# Patient Record
Sex: Male | Born: 1995 | Race: Black or African American | Hispanic: No | Marital: Single | Smoking: Current some day smoker
Health system: Southern US, Community
[De-identification: ages and names within clinical notes are randomized; demographics above are authoritative.]

---

## 2016-04-30 ENCOUNTER — Emergency Department (HOSPITAL_COMMUNITY): Payer: No Typology Code available for payment source

## 2016-04-30 ENCOUNTER — Observation Stay (HOSPITAL_COMMUNITY)
Admission: EM | Admit: 2016-04-30 | Discharge: 2016-05-01 | Disposition: A | Discharge status: Home / Self Care | Payer: No Typology Code available for payment source | Attending: Orthopedic Surgery | Admitting: Orthopedic Surgery

## 2016-04-30 ENCOUNTER — Encounter (HOSPITAL_COMMUNITY)
Admission: EM | Disposition: A | Discharge status: Home / Self Care | Payer: Self-pay | Source: Home / Self Care | Attending: Emergency Medicine

## 2016-04-30 ENCOUNTER — Observation Stay (HOSPITAL_COMMUNITY): Payer: No Typology Code available for payment source | Admitting: Anesthesiology

## 2016-04-30 ENCOUNTER — Encounter (HOSPITAL_COMMUNITY): Payer: Self-pay | Admitting: Emergency Medicine

## 2016-04-30 ENCOUNTER — Observation Stay (HOSPITAL_COMMUNITY): Payer: No Typology Code available for payment source

## 2016-04-30 DIAGNOSIS — Y998 Other external cause status: Secondary | ICD-10-CM | POA: Diagnosis not present

## 2016-04-30 DIAGNOSIS — M25522 Pain in left elbow: Secondary | ICD-10-CM | POA: Insufficient documentation

## 2016-04-30 DIAGNOSIS — Y9289 Other specified places as the place of occurrence of the external cause: Secondary | ICD-10-CM | POA: Insufficient documentation

## 2016-04-30 DIAGNOSIS — Y9389 Activity, other specified: Secondary | ICD-10-CM | POA: Insufficient documentation

## 2016-04-30 DIAGNOSIS — M25511 Pain in right shoulder: Secondary | ICD-10-CM | POA: Diagnosis not present

## 2016-04-30 DIAGNOSIS — M5382 Other specified dorsopathies, cervical region: Secondary | ICD-10-CM | POA: Diagnosis not present

## 2016-04-30 DIAGNOSIS — F17200 Nicotine dependence, unspecified, uncomplicated: Secondary | ICD-10-CM | POA: Insufficient documentation

## 2016-04-30 DIAGNOSIS — S52022B Displaced fracture of olecranon process without intraarticular extension of left ulna, initial encounter for open fracture type I or II: Secondary | ICD-10-CM | POA: Diagnosis not present

## 2016-04-30 DIAGNOSIS — S42402A Unspecified fracture of lower end of left humerus, initial encounter for closed fracture: Secondary | ICD-10-CM

## 2016-04-30 DIAGNOSIS — S42402B Unspecified fracture of lower end of left humerus, initial encounter for open fracture: Secondary | ICD-10-CM

## 2016-04-30 DIAGNOSIS — S0083XA Contusion of other part of head, initial encounter: Secondary | ICD-10-CM | POA: Insufficient documentation

## 2016-04-30 DIAGNOSIS — S0990XA Unspecified injury of head, initial encounter: Secondary | ICD-10-CM

## 2016-04-30 HISTORY — PX: INCISION AND DRAINAGE OF WOUND: SHX1803

## 2016-04-30 HISTORY — PX: ORIF ELBOW FRACTURE: SHX5031

## 2016-04-30 LAB — BASIC METABOLIC PANEL
ANION GAP: 12 (ref 5–15)
BUN: 10 mg/dL (ref 6–20)
CALCIUM: 9.5 mg/dL (ref 8.9–10.3)
CO2: 23 mmol/L (ref 22–32)
Chloride: 105 mmol/L (ref 101–111)
Creatinine, Ser: 1.18 mg/dL (ref 0.61–1.24)
Glucose, Bld: 99 mg/dL (ref 65–99)
Potassium: 3.6 mmol/L (ref 3.5–5.1)
SODIUM: 140 mmol/L (ref 135–145)

## 2016-04-30 LAB — TYPE AND SCREEN
ABO/RH(D): O POS
ANTIBODY SCREEN: NEGATIVE

## 2016-04-30 LAB — CBC WITH DIFFERENTIAL/PLATELET
BASOS PCT: 0 %
Basophils Absolute: 0 10*3/uL (ref 0.0–0.1)
Eosinophils Absolute: 0 10*3/uL (ref 0.0–0.7)
Eosinophils Relative: 0 %
HEMATOCRIT: 37.9 % — AB (ref 39.0–52.0)
HEMOGLOBIN: 13 g/dL (ref 13.0–17.0)
LYMPHS ABS: 1.2 10*3/uL (ref 0.7–4.0)
Lymphocytes Relative: 6 %
MCH: 30.4 pg (ref 26.0–34.0)
MCHC: 34.3 g/dL (ref 30.0–36.0)
MCV: 88.6 fL (ref 78.0–100.0)
MONOS PCT: 5 %
Monocytes Absolute: 1 10*3/uL (ref 0.1–1.0)
NEUTROS ABS: 16.9 10*3/uL — AB (ref 1.7–7.7)
NEUTROS PCT: 89 %
Platelets: 238 10*3/uL (ref 150–400)
RBC: 4.28 MIL/uL (ref 4.22–5.81)
RDW: 11.7 % (ref 11.5–15.5)
WBC: 19.2 10*3/uL — ABNORMAL HIGH (ref 4.0–10.5)

## 2016-04-30 LAB — ABO/RH: ABO/RH(D): O POS

## 2016-04-30 LAB — SURGICAL PCR SCREEN
MRSA, PCR: NEGATIVE
STAPHYLOCOCCUS AUREUS: NEGATIVE

## 2016-04-30 LAB — ETHANOL: Alcohol, Ethyl (B): 17 mg/dL — ABNORMAL HIGH (ref <5)

## 2016-04-30 SURGERY — IRRIGATION AND DEBRIDEMENT WOUND
Anesthesia: General | Site: Elbow | Laterality: Left

## 2016-04-30 MED ORDER — SODIUM CHLORIDE 0.9 % IV SOLN
INTRAVENOUS | Status: DC
  Administered 2016-04-30 (×2): via INTRAVENOUS

## 2016-04-30 MED ORDER — SUFENTANIL CITRATE 50 MCG/ML IV SOLN
INTRAVENOUS | Status: AC
  Filled 2016-04-30: qty 1

## 2016-04-30 MED ORDER — ACETAMINOPHEN 500 MG PO TABS
1000.0000 mg | ORAL_TABLET | Freq: Four times a day (QID) | ORAL | Status: DC
  Administered 2016-05-01 (×2): 1000 mg via ORAL
  Filled 2016-04-30 (×3): qty 2

## 2016-04-30 MED ORDER — BUPIVACAINE HCL (PF) 0.25 % IJ SOLN
INTRAMUSCULAR | Status: DC | PRN
  Administered 2016-04-30: 10 mL

## 2016-04-30 MED ORDER — LACTATED RINGERS IV SOLN
INTRAVENOUS | Status: DC | PRN
  Administered 2016-04-30 (×2): via INTRAVENOUS

## 2016-04-30 MED ORDER — MIDAZOLAM HCL 2 MG/2ML IJ SOLN
INTRAMUSCULAR | Status: AC
  Filled 2016-04-30: qty 2

## 2016-04-30 MED ORDER — PROPOFOL 10 MG/ML IV BOLUS
INTRAVENOUS | Status: AC
  Filled 2016-04-30: qty 20

## 2016-04-30 MED ORDER — LIDOCAINE 2% (20 MG/ML) 5 ML SYRINGE
INTRAMUSCULAR | Status: DC | PRN
  Administered 2016-04-30: 100 mg via INTRAVENOUS

## 2016-04-30 MED ORDER — CHLORHEXIDINE GLUCONATE 4 % EX LIQD: 60.0000 mL | Freq: Once | CUTANEOUS | Status: DC

## 2016-04-30 MED ORDER — ACETAMINOPHEN 650 MG RE SUPP: 650.0000 mg | Freq: Four times a day (QID) | RECTAL | Status: DC | PRN

## 2016-04-30 MED ORDER — PROPOFOL 10 MG/ML IV BOLUS
INTRAVENOUS | Status: DC | PRN
  Administered 2016-04-30: 200 mg via INTRAVENOUS

## 2016-04-30 MED ORDER — SODIUM CHLORIDE 0.9 % IJ SOLN
INTRAMUSCULAR | Status: AC
  Filled 2016-04-30: qty 10

## 2016-04-30 MED ORDER — SUCCINYLCHOLINE CHLORIDE 200 MG/10ML IV SOSY
PREFILLED_SYRINGE | INTRAVENOUS | Status: AC
  Filled 2016-04-30: qty 10

## 2016-04-30 MED ORDER — LIDOCAINE 2% (20 MG/ML) 5 ML SYRINGE
INTRAMUSCULAR | Status: AC
  Filled 2016-04-30: qty 5

## 2016-04-30 MED ORDER — CEFAZOLIN SODIUM 1 G IJ SOLR
INTRAMUSCULAR | Status: DC | PRN
  Administered 2016-04-30: 2 g via INTRAMUSCULAR

## 2016-04-30 MED ORDER — MIDAZOLAM HCL 5 MG/5ML IJ SOLN
INTRAMUSCULAR | Status: DC | PRN
  Administered 2016-04-30: 2 mg via INTRAVENOUS

## 2016-04-30 MED ORDER — LIDOCAINE-EPINEPHRINE (PF) 2 %-1:200000 IJ SOLN
10.0000 mL | Freq: Once | INTRAMUSCULAR | Status: AC
  Administered 2016-04-30: 10 mL via INTRADERMAL
  Filled 2016-04-30: qty 20

## 2016-04-30 MED ORDER — MORPHINE SULFATE (PF) 4 MG/ML IV SOLN: 2.0000 mg | INTRAVENOUS | Status: DC | PRN

## 2016-04-30 MED ORDER — CEFAZOLIN SODIUM-DEXTROSE 2-4 GM/100ML-% IV SOLN
2.0000 g | Freq: Once | INTRAVENOUS | Status: AC
  Administered 2016-04-30: 2 g via INTRAVENOUS
  Filled 2016-04-30: qty 100

## 2016-04-30 MED ORDER — TETANUS-DIPHTH-ACELL PERTUSSIS 5-2.5-18.5 LF-MCG/0.5 IM SUSP
0.5000 mL | Freq: Once | INTRAMUSCULAR | Status: AC
  Administered 2016-04-30: 0.5 mL via INTRAMUSCULAR
  Filled 2016-04-30: qty 0.5

## 2016-04-30 MED ORDER — ACETAMINOPHEN 325 MG PO TABS: 650.0000 mg | ORAL_TABLET | Freq: Four times a day (QID) | ORAL | Status: DC | PRN

## 2016-04-30 MED ORDER — HYDROMORPHONE HCL 2 MG/ML IJ SOLN: 0.2500 mg | INTRAMUSCULAR | Status: DC | PRN

## 2016-04-30 MED ORDER — DEXAMETHASONE SODIUM PHOSPHATE 10 MG/ML IJ SOLN
INTRAMUSCULAR | Status: AC
  Filled 2016-04-30: qty 1

## 2016-04-30 MED ORDER — SUFENTANIL CITRATE 50 MCG/ML IV SOLN
INTRAVENOUS | Status: DC | PRN
  Administered 2016-04-30: 10 ug via INTRAVENOUS
  Administered 2016-04-30: 15 ug via INTRAVENOUS
  Administered 2016-04-30: 25 ug via INTRAVENOUS

## 2016-04-30 MED ORDER — DOCUSATE SODIUM 100 MG PO CAPS
100.0000 mg | ORAL_CAPSULE | Freq: Two times a day (BID) | ORAL | Status: DC
  Administered 2016-05-01: 100 mg via ORAL
  Filled 2016-04-30: qty 1

## 2016-04-30 MED ORDER — DEXAMETHASONE SODIUM PHOSPHATE 10 MG/ML IJ SOLN
INTRAMUSCULAR | Status: DC | PRN
  Administered 2016-04-30: 10 mg via INTRAVENOUS

## 2016-04-30 MED ORDER — CEFAZOLIN SODIUM 1 G IJ SOLR
INTRAMUSCULAR | Status: AC
  Filled 2016-04-30: qty 20

## 2016-04-30 MED ORDER — POVIDONE-IODINE 10 % EX SWAB: 2.0000 "application " | Freq: Once | CUTANEOUS | Status: DC

## 2016-04-30 MED ORDER — OXYCODONE-ACETAMINOPHEN 5-325 MG PO TABS
2.0000 | ORAL_TABLET | Freq: Once | ORAL | Status: AC
  Administered 2016-04-30: 2 via ORAL
  Filled 2016-04-30: qty 2

## 2016-04-30 MED ORDER — ONDANSETRON HCL 4 MG/2ML IJ SOLN
INTRAMUSCULAR | Status: DC | PRN
  Administered 2016-04-30: 4 mg via INTRAVENOUS

## 2016-04-30 MED ORDER — SUCCINYLCHOLINE CHLORIDE 200 MG/10ML IV SOSY
PREFILLED_SYRINGE | INTRAVENOUS | Status: DC | PRN
  Administered 2016-04-30: 100 mg via INTRAVENOUS

## 2016-04-30 MED ORDER — HYDROMORPHONE HCL 1 MG/ML IJ SOLN
0.2500 mg | INTRAMUSCULAR | Status: DC | PRN
  Administered 2016-04-30: 0.5 mg via INTRAVENOUS

## 2016-04-30 MED ORDER — ONDANSETRON HCL 4 MG/2ML IJ SOLN
INTRAMUSCULAR | Status: AC
  Filled 2016-04-30: qty 2

## 2016-04-30 MED ORDER — BUPIVACAINE HCL (PF) 0.25 % IJ SOLN
INTRAMUSCULAR | Status: AC
  Filled 2016-04-30: qty 30

## 2016-04-30 MED ORDER — SODIUM CHLORIDE 0.9 % IR SOLN
Status: DC | PRN
  Administered 2016-04-30: 3000 mL

## 2016-04-30 MED ORDER — OXYCODONE HCL 5 MG PO TABS
5.0000 mg | ORAL_TABLET | ORAL | Status: DC | PRN
  Administered 2016-04-30 (×2): 10 mg via ORAL
  Administered 2016-05-01: 5 mg via ORAL
  Administered 2016-05-01: 10 mg via ORAL
  Filled 2016-04-30: qty 2
  Filled 2016-04-30: qty 1
  Filled 2016-04-30 (×2): qty 2

## 2016-04-30 MED ORDER — HYDROMORPHONE HCL 1 MG/ML IJ SOLN
INTRAMUSCULAR | Status: AC
  Filled 2016-04-30: qty 0.5

## 2016-04-30 MED ORDER — CEFAZOLIN SODIUM-DEXTROSE 2-4 GM/100ML-% IV SOLN
2.0000 g | INTRAVENOUS | Status: DC
  Filled 2016-04-30: qty 100

## 2016-04-30 SURGICAL SUPPLY — 69 items
BANDAGE ACE 4X5 VEL STRL LF (GAUZE/BANDAGES/DRESSINGS) ×2 IMPLANT
BANDAGE ACE 6X5 VEL STRL LF (GAUZE/BANDAGES/DRESSINGS) ×2 IMPLANT
BIT DRILL 2.5X2.75 QC CALB (BIT) ×2 IMPLANT
BIT DRILL CALIBRATED 2.7 (BIT) ×2 IMPLANT
BNDG COHESIVE 4X5 TAN STRL (GAUZE/BANDAGES/DRESSINGS) ×2 IMPLANT
CANISTER SUCTION 1500CC (MISCELLANEOUS) ×2 IMPLANT
COVER SURGICAL LIGHT HANDLE (MISCELLANEOUS) ×2 IMPLANT
CUFF TOURNIQUET SINGLE 18IN (TOURNIQUET CUFF) ×2 IMPLANT
CUFF TOURNIQUET SINGLE 24IN (TOURNIQUET CUFF) ×2 IMPLANT
DRAPE C-ARM 42X72 X-RAY (DRAPES) ×4 IMPLANT
DRAPE IMP U-DRAPE 54X76 (DRAPES) ×2 IMPLANT
DRAPE U-SHAPE 47X51 STRL (DRAPES) ×2 IMPLANT
DRSG PAD ABDOMINAL 8X10 ST (GAUZE/BANDAGES/DRESSINGS) ×2 IMPLANT
ELECT BLADE 4.0 EZ CLEAN MEGAD (MISCELLANEOUS) ×2
ELECT CAUTERY BLADE 6.4 (BLADE) ×2 IMPLANT
ELECT REM PT RETURN 9FT ADLT (ELECTROSURGICAL) ×2
ELECTRODE BLDE 4.0 EZ CLN MEGD (MISCELLANEOUS) ×1 IMPLANT
ELECTRODE REM PT RTRN 9FT ADLT (ELECTROSURGICAL) ×1 IMPLANT
FLUID NSS /IRRIG 3000 ML XXX (IV SOLUTION) ×2 IMPLANT
GAUZE SPONGE 4X4 12PLY STRL (GAUZE/BANDAGES/DRESSINGS) ×4 IMPLANT
GAUZE XEROFORM 1X8 LF (GAUZE/BANDAGES/DRESSINGS) ×2 IMPLANT
GAUZE XEROFORM 5X9 LF (GAUZE/BANDAGES/DRESSINGS) ×2 IMPLANT
GLOVE BIO SURGEON STRL SZ7.5 (GLOVE) ×2 IMPLANT
GLOVE BIOGEL PI IND STRL 8 (GLOVE) ×1 IMPLANT
GLOVE BIOGEL PI INDICATOR 8 (GLOVE) ×1
GLOVE SKINSENSE NS SZ7.5 (GLOVE) ×2
GLOVE SKINSENSE STRL SZ7.5 (GLOVE) ×2 IMPLANT
GOWN STRL REIN XL XLG (GOWN DISPOSABLE) ×2 IMPLANT
GOWN STRL REUS W/ TWL LRG LVL3 (GOWN DISPOSABLE) ×2 IMPLANT
GOWN STRL REUS W/ TWL XL LVL3 (GOWN DISPOSABLE) ×1 IMPLANT
GOWN STRL REUS W/TWL LRG LVL3 (GOWN DISPOSABLE) ×2
GOWN STRL REUS W/TWL XL LVL3 (GOWN DISPOSABLE) ×1
KIT BASIN OR (CUSTOM PROCEDURE TRAY) ×2 IMPLANT
KIT ROOM TURNOVER OR (KITS) ×2 IMPLANT
MANIFOLD NEPTUNE II (INSTRUMENTS) ×2 IMPLANT
NS IRRIG 1000ML POUR BTL (IV SOLUTION) ×2 IMPLANT
PACK ORTHO EXTREMITY (CUSTOM PROCEDURE TRAY) ×2 IMPLANT
PACK SHOULDER (CUSTOM PROCEDURE TRAY) ×2 IMPLANT
PAD ARMBOARD 7.5X6 YLW CONV (MISCELLANEOUS) ×4 IMPLANT
PAD CAST 4YDX4 CTTN HI CHSV (CAST SUPPLIES) ×3 IMPLANT
PADDING CAST ABS 4INX4YD NS (CAST SUPPLIES) ×2
PADDING CAST ABS COTTON 4X4 ST (CAST SUPPLIES) ×2 IMPLANT
PADDING CAST COTTON 4X4 STRL (CAST SUPPLIES) ×3
PADDING CAST COTTON 6X4 STRL (CAST SUPPLIES) ×2 IMPLANT
PLATE OLECRANON LRG (Plate) ×2 IMPLANT
SCREW CORT T15 24X3.5XST LCK (Screw) ×1 IMPLANT
SCREW CORT T15 30X3.5XST LCK (Screw) ×1 IMPLANT
SCREW CORTICAL 3.5X24MM (Screw) ×1 IMPLANT
SCREW CORTICAL 3.5X30MM (Screw) ×1 IMPLANT
SCREW LOCK CORT STAR 3.5X12 (Screw) ×2 IMPLANT
SCREW LOCK CORT STAR 3.5X18 (Screw) ×2 IMPLANT
SCREW LOCK CORT STAR 3.5X26 (Screw) ×2 IMPLANT
SCREW LOCK CORT STAR 3.5X54 (Screw) ×2 IMPLANT
SCREW LOW PROFILE 22MMX3.5MM (Screw) ×2 IMPLANT
SLING ARM IMMOBILIZER LRG (SOFTGOODS) ×2 IMPLANT
SPONGE GAUZE 4X4 12PLY STER LF (GAUZE/BANDAGES/DRESSINGS) ×2 IMPLANT
SPONGE LAP 18X18 X RAY DECT (DISPOSABLE) ×4 IMPLANT
STOCKINETTE IMPERVIOUS 9X36 MD (GAUZE/BANDAGES/DRESSINGS) ×2 IMPLANT
SUT VIC AB 0 CT1 27 (SUTURE) ×1
SUT VIC AB 0 CT1 27XBRD ANBCTR (SUTURE) ×1 IMPLANT
SUT VIC AB 2-0 FS1 27 (SUTURE) ×4 IMPLANT
TOWEL OR 17X24 6PK STRL BLUE (TOWEL DISPOSABLE) ×2 IMPLANT
TOWEL OR 17X26 10 PK STRL BLUE (TOWEL DISPOSABLE) ×2 IMPLANT
TUBING BULK SUCTION (MISCELLANEOUS) ×2 IMPLANT
TUBING CYSTO DISP (UROLOGICAL SUPPLIES) ×2 IMPLANT
UNDERPAD 30X30 (UNDERPADS AND DIAPERS) ×2 IMPLANT
WASHER 3.5MM (Orthopedic Implant) ×4 IMPLANT
WIRE K 1.6MM 144256 (MISCELLANEOUS) ×4 IMPLANT
YANKAUER SUCT BULB TIP NO VENT (SUCTIONS) ×2 IMPLANT
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
## 2016-04-30 NOTE — Transfer of Care (Signed)
Immediate Anesthesia Transfer of Care Note  Patient: Andrew Strong  Procedure(s) Performed: Procedure(s): IRRIGATION AND DEBRIDEMENT WOUND (Left) OPEN REDUCTION INTERNAL FIXATION (ORIF) ELBOW/OLECRANON FRACTURE (Left)  Patient Location: PACU  Anesthesia Type:General  Level of Consciousness: sedated, patient cooperative and responds to stimulation  Airway & Oxygen Therapy: Patient Spontanous Breathing  Post-op Assessment: Report given to RN, Post -op Vital signs reviewed and stable and Patient moving all extremities X 4  Post vital signs: Reviewed and stable  Last Vitals:  Vitals:   04/30/16 0800 04/30/16 1300  BP: 136/79 128/63  Pulse: 96 86  Resp: 16 16  Temp:  37.2 C    Last Pain:  Vitals:   04/30/16 1957  TempSrc:   PainSc: 6       Patients Stated Pain Goal: 3 (04/30/16 1957)  Complications: No apparent anesthesia complications

## 2016-04-30 NOTE — Progress Notes (Signed)
Orthopedic Tech Progress Note Patient Details:  Andrew Strong [birth year] [id number]  Ortho Devices Type of Ortho Device: Ace wrap, Long arm splint Ortho Device/Splint Interventions: Application   Andrew Strong 04/30/2016, 7:47 AM
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
## 2016-04-30 NOTE — Anesthesia Procedure Notes (Signed)
Procedure Name: Intubation Date/Time: 04/30/2016 9:16 PM Performed by: Melina SchoolsBANKS, Imo Cumbie J Pre-anesthesia Checklist: Patient identified, Emergency Drugs available, Suction available, Patient being monitored and Timeout performed Patient Re-evaluated:Patient Re-evaluated prior to inductionOxygen Delivery Method: Circle system utilized Preoxygenation: Pre-oxygenation with 100% oxygen Intubation Type: IV induction Ventilation: Mask ventilation without difficulty Laryngoscope Size: Mac and 4 Grade View: Grade II Tube type: Oral Tube size: 8.0 mm Number of attempts: 1 Airway Equipment and Method: Stylet Placement Confirmation: ETT inserted through vocal cords under direct vision,  positive ETCO2 and breath sounds checked- equal and bilateral Secured at: 24 cm Tube secured with: Tape Dental Injury: Teeth and Oropharynx as per pre-operative assessment
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
## 2016-04-30 NOTE — Anesthesia Postprocedure Evaluation (Signed)
Anesthesia Post Note  Patient: Berenice Boutonrevon Dehn  Procedure(s) Performed: Procedure(s) (LRB): IRRIGATION AND DEBRIDEMENT WOUND (Left) OPEN REDUCTION INTERNAL FIXATION (ORIF) ELBOW/OLECRANON FRACTURE (Left)  Patient location during evaluation: PACU Anesthesia Type: General Level of consciousness: awake and alert Pain management: pain level controlled Vital Signs Assessment: post-procedure vital signs reviewed and stable Respiratory status: spontaneous breathing, nonlabored ventilation and respiratory function stable Cardiovascular status: blood pressure returned to baseline and stable Postop Assessment: no signs of nausea or vomiting Anesthetic complications: no       Last Vitals:  Vitals:   04/30/16 2330 04/30/16 2345  BP: 135/74 (!) 145/80  Pulse: 88 80  Resp: 17 18  Temp:      Last Pain:  Vitals:   04/30/16 2330  TempSrc:   PainSc: Asleep                 Joah Patlan,W. EDMOND

## 2016-04-30 NOTE — Anesthesia Preprocedure Evaluation (Addendum)
Anesthesia Evaluation  Patient identified by MRN, date of birth, ID band Patient awake    Reviewed: Allergy & Precautions, H&P , NPO status , Patient's Chart, lab work & pertinent test results  Airway Mallampati: II  TM Distance: >3 FB Neck ROM: Full    Dental no notable dental hx. (+) Teeth Intact, Dental Advisory Given   Pulmonary Current Smoker,    Pulmonary exam normal breath sounds clear to auscultation       Cardiovascular negative cardio ROS   Rhythm:Regular Rate:Normal     Neuro/Psych negative neurological ROS  negative psych ROS   GI/Hepatic negative GI ROS, Neg liver ROS,   Endo/Other  negative endocrine ROS  Renal/GU negative Renal ROS  negative genitourinary   Musculoskeletal   Abdominal   Peds  Hematology negative hematology ROS (+)   Anesthesia Other Findings   Reproductive/Obstetrics negative OB ROS                             Anesthesia Physical Anesthesia Plan  ASA: II  Anesthesia Plan: General   Post-op Pain Management:    Induction: Intravenous  Airway Management Planned: Oral ETT  Additional Equipment:   Intra-op Plan:   Post-operative Plan: Extubation in OR  Informed Consent: I have reviewed the patients History and Physical, chart, labs and discussed the procedure including the risks, benefits and alternatives for the proposed anesthesia with the patient or authorized representative who has indicated his/her understanding and acceptance.   Dental advisory given  Plan Discussed with: CRNA  Anesthesia Plan Comments:         Anesthesia Quick Evaluation  

## 2016-05-01 MED ORDER — ONDANSETRON HCL 4 MG/2ML IJ SOLN: 4.0000 mg | Freq: Four times a day (QID) | INTRAMUSCULAR | Status: DC | PRN

## 2016-05-01 MED ORDER — METOCLOPRAMIDE HCL 5 MG PO TABS: 5.0000 mg | ORAL_TABLET | Freq: Three times a day (TID) | ORAL | Status: DC | PRN

## 2016-05-01 MED ORDER — OXYCODONE HCL 5 MG PO TABS: 5.0000 mg | ORAL_TABLET | ORAL | 0 refills | Status: AC | PRN

## 2016-05-01 MED ORDER — ACETAMINOPHEN 325 MG PO TABS: 650.0000 mg | ORAL_TABLET | Freq: Four times a day (QID) | ORAL | Status: DC | PRN

## 2016-05-01 MED ORDER — METOCLOPRAMIDE HCL 5 MG/ML IJ SOLN
5.0000 mg | Freq: Three times a day (TID) | INTRAMUSCULAR | Status: DC | PRN
Start: 2016-05-01 — End: 2016-05-01

## 2016-05-01 MED ORDER — ACETAMINOPHEN 650 MG RE SUPP: 650.0000 mg | Freq: Four times a day (QID) | RECTAL | Status: DC | PRN

## 2016-05-01 MED ORDER — CEFAZOLIN SODIUM-DEXTROSE 2-4 GM/100ML-% IV SOLN
2.0000 g | Freq: Four times a day (QID) | INTRAVENOUS | Status: AC
  Administered 2016-05-01 (×3): 2 g via INTRAVENOUS
  Filled 2016-05-01 (×3): qty 100

## 2016-05-01 MED ORDER — ONDANSETRON HCL 4 MG PO TABS: 4.0000 mg | ORAL_TABLET | Freq: Four times a day (QID) | ORAL | Status: DC | PRN

## 2016-05-01 NOTE — Discharge Instructions (Signed)
Nonweightbearing on the left upper extremity Keep posterior splint and sling in place Follow up in the office for suture/staple removal in 2 weeks

## 2016-05-01 NOTE — Progress Notes (Signed)
   Subjective: 1 Day Post-Op Procedure(s) (LRB): IRRIGATION AND DEBRIDEMENT WOUND (Left) OPEN REDUCTION INTERNAL FIXATION (ORIF) ELBOW/OLECRANON FRACTURE (Left) Patient reports pain as moderate.   Patient seen in rounds for Dr. Aundria Rudogers. Patient is well, and has had no acute complaints or problems other than discomfort in the left arm. No SOB or chest pain. Patient sleeping comfortably before rounds and easily returned to sleep after rounds.    Objective: Vital signs in last 24 hours: Temp:  [98.6 F (37 C)-99.6 F (37.6 C)] 98.6 F (37 C) (12/23 0538) Pulse Rate:  [75-97] 81 (12/23 0538) Resp:  [16-19] 16 (12/23 0030) BP: (126-154)/(63-83) 141/72 (12/23 0538) SpO2:  [88 %-98 %] 98 % (12/23 0538)  Intake/Output from previous day:  Intake/Output Summary (Last 24 hours) at 05/01/16 0742 Last data filed at 04/30/16 2309  Gross per 24 hour  Intake             1400 ml  Output             1020 ml  Net              380 ml     Labs:  Recent Labs  04/30/16 0730  HGB 13.0    Recent Labs  04/30/16 0730  WBC 19.2*  RBC 4.28  HCT 37.9*  PLT 238    Recent Labs  04/30/16 0547  NA 140  K 3.6  CL 105  CO2 23  BUN 10  CREATININE 1.18  GLUCOSE 99  CALCIUM 9.5    EXAM General - Patient is Alert and Oriented Extremity - Neurologically intact Neurovascular intact Dressing/Incision - clean, dry, no drainage Motor Function - intact, moving fingers and hand well on exam.   History reviewed. No pertinent past medical history.  Assessment/Plan: 1 Day Post-Op Procedure(s) (LRB): IRRIGATION AND DEBRIDEMENT WOUND (Left) OPEN REDUCTION INTERNAL FIXATION (ORIF) ELBOW/OLECRANON FRACTURE (Left) Active Problems:   Open fracture of left olecranon  Estimated body mass index is 23.73 kg/m as calculated from the following:   Height as of this encounter: 6' (1.829 m).   Weight as of this encounter: 79.4 kg (175 lb). Advance diet D/C IV fluids when tolerating POs  well  Maintain posterior splint and sling NWB left UE Patient should complete IV antibiotics, Ancef 2g q6h, due to open fracture.  Will discuss with Dr. Aundria Rudogers DC plan   Andrew PedAmber Zeshan Sena, PA-C Orthopaedic Surgery 05/01/2016, 7:42 AM

## 2016-05-01 NOTE — Op Note (Signed)
04/30/2016  12:09 AM  PATIENT:  Andrew Strong    PRE-OPERATIVE DIAGNOSIS:  Open Left Olecranon Fracture  POST-OPERATIVE DIAGNOSIS:  Same  PROCEDURE:  1. IRRIGATION AND DEBRIDEMENT open fracture, bone, muscle subcutaneous tissue and skin 2.  OPEN REDUCTION INTERNAL FIXATION (ORIF) ELBOW/OLECRANON FRACTURE  SURGEON:  Yolonda KidaJason Patrick Araly Kaas, MD   ANESTHESIA:   General  PREOPERATIVE INDICATIONS:  Andrew Boutonrevon Waas is a  20 y.o. male with a diagnosis of Open Left Olecranon Fracture who elected for surgical management.    The risks benefits and alternatives were discussed with the patient including but not limited to the risks of nonoperative treatment, versus surgical intervention including infection, bleeding, nerve injury, malunion, nonunion, the need for revision surgery, hardware prominence, hardware failure, the need for hardware removal, blood clots, cardiopulmonary complications, morbidity, mortality, among others, and they were willing to proceed.    OPERATIVE IMPLANTS: Biomet proximal ulnar locking plate  OPERATIVE FINDINGS: Type II open left comminuted olecranon fracture. There was comminution noted at the olecranon tip with plastic deformity on the ulnar border as well. There was no gross contamination noted.  OPERATIVE PROCEDURE: The patient was brought to the operating room and placed in the supine position.  General anesthesia was administered, and IV antibiotics were  given.  Time out was performed and the upper extremity was prepped and draped in the usual sterile fashion and tourniquet was utilized at 250 mmHg.  He was placed in a semilateral position the arm supported across the chest.  We first began by addressing the open fracture. There was an approximate 4 cm laceration noted over the subcutaneous border of the ulna. The fracture was noted to be palpated directly in continuity with that wound. I extended this in line with the ulna to use for debridement as well as for my surgical  approach. The ulna itself was debrided with rongeur and curette and knife. All devitalized bone and interposed tissue between the fracture itself was debrided and removed. In a similar fashion the skin subcutaneous tissues tissue and muscle was debrided around the wound sharply with rongeur and knife. The excisional debridement resulted in healthy bleeding tissue.  Following the debridement the open fracture was copiously irrigated with 6 L of normal saline. Next we turned our attention to fixation of the fracture.  The ulnar nerve was protected and not exposed.  The fracture was held anatomically with a clamp and provisionally pinned in place.  I then applied the precontoured olecranon plate into the appropriate position. C-arm was used to confirm reduction and plate position.  There was noted to be comminution and bone loss on the articular side of the fracture despite anatomic cortical reduction noted through direct visualization. The plate was secured with distal interlocking screws first in the sliding hole and then the fracture was fixed proximally with unicortical locking screws.  The distal screws were also placed for added fixation.  The elbow articular reduction was congruent on c-arm and moved as unit.  The tissue was closed with vicryl to cover the hardware. Next the subcutaneous tissues tissue was closed with 2-0 Vicryl sutures. Skin itself was closed with staples, and sterile gauze followed by a posterior splint.  The tourniquet was released and He was awakened and returned to the PACU in stable and satisfactory condition.  There were no complications and He tolerated the procedure well.  Post operative plan: Andrew Boutonrevon Shur He will be nonweightbearing to the left upper extremity, he'll return to the floor for postoperative pain management.  We will plan on seeing him back in the office in 2 weeks for staple removal and will begin a passive range of motion protocol at that point in time. He will get 2  more doses of antibiotics for his open fracture.

## 2016-05-04 ENCOUNTER — Encounter (HOSPITAL_COMMUNITY): Payer: Self-pay | Admitting: Orthopedic Surgery
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
## 2016-08-24 ENCOUNTER — Emergency Department (HOSPITAL_BASED_OUTPATIENT_CLINIC_OR_DEPARTMENT_OTHER)
Admission: EM | Admit: 2016-08-24 | Discharge: 2016-08-24 | Disposition: A | Discharge status: Home / Self Care | Payer: Self-pay | Attending: Emergency Medicine | Admitting: Emergency Medicine

## 2016-08-24 ENCOUNTER — Encounter (HOSPITAL_BASED_OUTPATIENT_CLINIC_OR_DEPARTMENT_OTHER): Payer: Self-pay | Admitting: Emergency Medicine

## 2016-08-24 DIAGNOSIS — H1032 Unspecified acute conjunctivitis, left eye: Secondary | ICD-10-CM

## 2016-08-24 DIAGNOSIS — H1089 Other conjunctivitis: Secondary | ICD-10-CM | POA: Insufficient documentation

## 2016-08-24 DIAGNOSIS — F17200 Nicotine dependence, unspecified, uncomplicated: Secondary | ICD-10-CM | POA: Insufficient documentation

## 2016-08-24 MED ORDER — TETRACAINE HCL 0.5 % OP SOLN
1.0000 [drp] | Freq: Once | OPHTHALMIC | Status: AC
  Administered 2016-08-24: 1 [drp] via OPHTHALMIC

## 2016-08-24 MED ORDER — TETRACAINE HCL 0.5 % OP SOLN
OPHTHALMIC | Status: AC
  Filled 2016-08-24: qty 4

## 2016-08-24 MED ORDER — FLUORESCEIN SODIUM 0.6 MG OP STRP
1.0000 | ORAL_STRIP | Freq: Once | OPHTHALMIC | Status: AC
  Administered 2016-08-24: 1 via OPHTHALMIC
  Filled 2016-08-24: qty 1

## 2016-08-24 MED ORDER — ERYTHROMYCIN 5 MG/GM OP OINT
TOPICAL_OINTMENT | Freq: Four times a day (QID) | OPHTHALMIC | Status: DC
  Administered 2016-08-24: 11:00:00 via OPHTHALMIC
  Filled 2016-08-24: qty 3.5

## 2016-08-24 NOTE — ED Provider Notes (Signed)
[facility] Provider Note   CSN: [id number] Arrival date & time: 08/24/16  1008     History   Chief Complaint Chief Complaint  Patient presents with  . Eye Problem    HPI Andrew Strong is a 21 y.o. male.  The history is provided by the patient.  Eye Problem   This is a new problem. The current episode started more than 2 days ago. The problem occurs daily. The problem has been gradually worsening. There is a problem in the left eye. There was no injury mechanism. The pain is mild. There is no history of trauma to the eye. There is no known exposure to pink eye. He does not wear contacts. Associated symptoms include discharge, eye redness and itching. Pertinent negatives include no blurred vision, no decreased vision, no foreign body sensation and no photophobia.    PMH - none  Patient Active Problem List   Diagnosis Date Noted  . Open fracture of left olecranon 04/30/2016    Past Surgical History:  Procedure Laterality Date  . INCISION AND DRAINAGE OF WOUND Left 04/30/2016   Procedure: IRRIGATION AND DEBRIDEMENT WOUND;  Surgeon: Yolonda Kida, MD;  Location: [facility] OR;  Service: Orthopedics;  Laterality: Left;  . ORIF ELBOW FRACTURE Left 04/30/2016   Procedure: OPEN REDUCTION INTERNAL FIXATION (ORIF) ELBOW/OLECRANON FRACTURE;  Surgeon: Yolonda Kida, MD;  Location: [facility] OR;  Service: Orthopedics;  Laterality: Left;       Home Medications    Prior to Admission medications   Medication Sig Start Date End Date Taking? Authorizing Provider  oxyCODONE (OXY IR/ROXICODONE) 5 MG immediate release tablet Take 1-2 tablets (5-10 mg total) by mouth every 3 (three) hours as needed for breakthrough pain. 05/01/16   Dimitri Ped, PA-C    Family History History reviewed. No pertinent family history.  Social History Social History  Substance Use Topics  . Smoking status: Current Some Day Smoker  . Smokeless tobacco: Never Used  . Alcohol use Not on file       Allergies   Patient has no known allergies.   Review of Systems Review of Systems  Constitutional: Negative for fever.  HENT: Negative for rhinorrhea.   Eyes: Positive for discharge and redness. Negative for blurred vision and photophobia.  Respiratory: Negative for cough.   Skin: Positive for itching.     Physical Exam Updated Vital Signs BP (!) 142/81 (BP Location: Left Arm)   Pulse 71   Temp 98.3 F (36.8 C) (Oral)   Resp 18   Ht 6' (1.829 m)   Wt 77.1 kg   SpO2 98%   BMI 23.06 kg/m   Physical Exam CONSTITUTIONAL: Well developed/well nourished HEAD: Normocephalic/atraumatic EYES: EOMI/PERRL, no consensual pain, no proptosis, no foreign bodies, small amt of discharge to left eye, conjunctival erythema noted to OS, no corneal abrasion noted, no hyphema/hypopyon ENMT: Mucous membranes moist NECK: supple no meningeal signs CV: S1/S2 noted, no murmurs/rubs/gallops noted LUNGS: Lungs are clear to auscultation bilaterally, no apparent distress NEURO: Pt is awake/alert/appropriate, moves all extremitiesx4.  No facial droop.   EXTREMITIES: full ROM SKIN: warm, color normal PSYCH: no abnormalities of mood noted, alert and oriented to situation   ED Treatments / Results  Labs (all labs ordered are listed, but only abnormal results are displayed) Labs Reviewed - No data to display  EKG  EKG Interpretation None       Radiology No results found.  Procedures Procedures (including critical care time)  Medications Ordered in  ED Medications  tetracaine (PONTOCAINE) 0.5 % ophthalmic solution (not administered)  erythromycin ophthalmic ointment (not administered)  fluorescein ophthalmic strip 1 strip (1 strip Left Eye Given 08/24/16 1041)  tetracaine (PONTOCAINE) 0.5 % ophthalmic solution 1 drop (1 drop Left Eye Given by Other 08/24/16 1041)     Initial Impression / Assessment and Plan / ED Course  I have reviewed the triage vital signs and the nursing  notes.      Suspect bacterial conjunctivitis Doubt iritis, no signs of orbital cellulitis   Final Clinical Impressions(s) / ED Diagnoses   Final diagnoses:  Acute bacterial conjunctivitis of left eye    New Prescriptions New Prescriptions   No medications on file     Andrew Rhine, MD 08/24/16 1054

## 2016-08-24 NOTE — ED Triage Notes (Signed)
Pt c/o LT eye tearing, pain, redness x 3d
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
